# Patient Record
Sex: Female | Born: 1980 | Race: Black or African American | Hispanic: No | Marital: Single | Smoking: Never smoker
Health system: Southern US, Community
[De-identification: ages and names within clinical notes are randomized; demographics above are authoritative.]

## PROBLEM LIST (undated history)

## (undated) DIAGNOSIS — R159 Full incontinence of feces: Secondary | ICD-10-CM

## (undated) DIAGNOSIS — D649 Anemia, unspecified: Secondary | ICD-10-CM

## (undated) DIAGNOSIS — M545 Low back pain, unspecified: Secondary | ICD-10-CM

## (undated) DIAGNOSIS — K219 Gastro-esophageal reflux disease without esophagitis: Secondary | ICD-10-CM

## (undated) DIAGNOSIS — F329 Major depressive disorder, single episode, unspecified: Secondary | ICD-10-CM

## (undated) DIAGNOSIS — F32A Depression, unspecified: Secondary | ICD-10-CM

## (undated) DIAGNOSIS — Z91018 Allergy to other foods: Secondary | ICD-10-CM

## (undated) DIAGNOSIS — K5900 Constipation, unspecified: Secondary | ICD-10-CM

## (undated) DIAGNOSIS — F419 Anxiety disorder, unspecified: Secondary | ICD-10-CM

## (undated) HISTORY — PX: DENTAL SURGERY: SHX609

---

## 2013-05-13 HISTORY — PX: MYOMECTOMY: SHX85

## 2015-03-30 ENCOUNTER — Other Ambulatory Visit: Payer: Self-pay | Admitting: Nurse Practitioner

## 2015-03-30 DIAGNOSIS — R740 Nonspecific elevation of levels of transaminase and lactic acid dehydrogenase [LDH]: Principal | ICD-10-CM

## 2015-03-30 DIAGNOSIS — R1011 Right upper quadrant pain: Secondary | ICD-10-CM

## 2015-03-30 DIAGNOSIS — R7401 Elevation of levels of liver transaminase levels: Secondary | ICD-10-CM

## 2015-04-07 ENCOUNTER — Ambulatory Visit
Admission: RE | Admit: 2015-04-07 | Discharge: 2015-04-07 | Disposition: A | Discharge status: Home / Self Care | Source: Ambulatory Visit | Attending: Nurse Practitioner | Admitting: Nurse Practitioner

## 2015-04-07 DIAGNOSIS — R7401 Elevation of levels of liver transaminase levels: Secondary | ICD-10-CM

## 2015-04-07 DIAGNOSIS — R1011 Right upper quadrant pain: Secondary | ICD-10-CM

## 2015-04-07 DIAGNOSIS — R740 Nonspecific elevation of levels of transaminase and lactic acid dehydrogenase [LDH]: Secondary | ICD-10-CM | POA: Insufficient documentation

## 2015-06-30 NOTE — Discharge Instructions (Signed)

## 2015-07-01 ENCOUNTER — Ambulatory Visit
Admission: RE | Admit: 2015-07-01 | Discharge: 2015-07-01 | Disposition: A | Discharge status: Home / Self Care | Source: Ambulatory Visit | Attending: Gastroenterology | Admitting: Gastroenterology

## 2015-07-01 ENCOUNTER — Encounter
Admission: RE | Disposition: A | Discharge status: Home / Self Care | Payer: Self-pay | Source: Ambulatory Visit | Attending: Gastroenterology

## 2015-07-01 ENCOUNTER — Ambulatory Visit: Admitting: Anesthesiology

## 2015-07-01 DIAGNOSIS — F419 Anxiety disorder, unspecified: Secondary | ICD-10-CM | POA: Insufficient documentation

## 2015-07-01 DIAGNOSIS — K219 Gastro-esophageal reflux disease without esophagitis: Secondary | ICD-10-CM | POA: Insufficient documentation

## 2015-07-01 DIAGNOSIS — Z79899 Other long term (current) drug therapy: Secondary | ICD-10-CM | POA: Insufficient documentation

## 2015-07-01 DIAGNOSIS — K5900 Constipation, unspecified: Secondary | ICD-10-CM | POA: Diagnosis not present

## 2015-07-01 DIAGNOSIS — R159 Full incontinence of feces: Secondary | ICD-10-CM | POA: Diagnosis not present

## 2015-07-01 DIAGNOSIS — Z793 Long term (current) use of hormonal contraceptives: Secondary | ICD-10-CM | POA: Diagnosis not present

## 2015-07-01 HISTORY — DX: Constipation, unspecified: K59.00

## 2015-07-01 HISTORY — DX: Gastro-esophageal reflux disease without esophagitis: K21.9

## 2015-07-01 HISTORY — DX: Full incontinence of feces: R15.9

## 2015-07-01 HISTORY — DX: Anemia, unspecified: D64.9

## 2015-07-01 HISTORY — DX: Allergy to other foods: Z91.018

## 2015-07-01 HISTORY — PX: COLONOSCOPY: SHX5424

## 2015-07-01 HISTORY — DX: Low back pain, unspecified: M54.50

## 2015-07-01 HISTORY — DX: Low back pain: M54.5

## 2015-07-01 SURGERY — COLONOSCOPY
Anesthesia: Monitor Anesthesia Care | Wound class: Contaminated

## 2015-07-01 MED ORDER — STERILE WATER FOR IRRIGATION IR SOLN
Status: DC | PRN
  Administered 2015-07-01: 09:00:00

## 2015-07-01 MED ORDER — OXYCODONE HCL 5 MG PO TABS: 5.0000 mg | ORAL_TABLET | Freq: Once | ORAL | Status: DC | PRN

## 2015-07-01 MED ORDER — OXYCODONE HCL 5 MG/5ML PO SOLN: 5.0000 mg | Freq: Once | ORAL | Status: DC | PRN

## 2015-07-01 MED ORDER — LACTATED RINGERS IV SOLN
INTRAVENOUS | Status: DC
  Administered 2015-07-01: 09:00:00 via INTRAVENOUS

## 2015-07-01 MED ORDER — SODIUM CHLORIDE 0.9 % IV SOLN: INTRAVENOUS | Status: DC

## 2015-07-01 MED ORDER — PROPOFOL 10 MG/ML IV BOLUS
INTRAVENOUS | Status: DC | PRN
  Administered 2015-07-01 (×3): 50 mg via INTRAVENOUS
  Administered 2015-07-01: 100 mg via INTRAVENOUS
  Administered 2015-07-01 (×3): 50 mg via INTRAVENOUS

## 2015-07-01 MED ORDER — LIDOCAINE HCL (CARDIAC) 20 MG/ML IV SOLN
INTRAVENOUS | Status: DC | PRN
  Administered 2015-07-01: 50 mg via INTRAVENOUS

## 2015-07-01 SURGICAL SUPPLY — 30 items
CANISTER SUCT 1200ML W/VALVE (MISCELLANEOUS) ×3 IMPLANT
FCP ESCP3.2XJMB 240X2.8X (MISCELLANEOUS)
FORCEPS BIOP RAD 4 LRG CAP 4 (CUTTING FORCEPS) ×3 IMPLANT
FORCEPS BIOP RJ4 240 W/NDL (MISCELLANEOUS)
FORCEPS ESCP3.2XJMB 240X2.8X (MISCELLANEOUS) IMPLANT
GOWN CVR UNV OPN BCK APRN NK (MISCELLANEOUS) ×1 IMPLANT
GOWN ISOL THUMB LOOP REG UNIV (MISCELLANEOUS) ×2
GOWN STRL REUS W/ TWL LRG LVL3 (GOWN DISPOSABLE) ×1 IMPLANT
GOWN STRL REUS W/TWL LRG LVL3 (GOWN DISPOSABLE) ×2
HEMOCLIP INSTINCT (CLIP) IMPLANT
INJECTOR VARIJECT VIN23 (MISCELLANEOUS) IMPLANT
KIT CO2 TUBING (TUBING) IMPLANT
KIT DEFENDO VALVE AND CONN (KITS) IMPLANT
KIT ENDO PROCEDURE OLY (KITS) ×3 IMPLANT
LIGATOR MULTIBAND 6SHOOTER MBL (MISCELLANEOUS) IMPLANT
MARKER SPOT ENDO TATTOO 5ML (MISCELLANEOUS) IMPLANT
PAD GROUND ADULT SPLIT (MISCELLANEOUS) IMPLANT
SNARE SHORT THROW 13M SML OVAL (MISCELLANEOUS) IMPLANT
SNARE SHORT THROW 30M LRG OVAL (MISCELLANEOUS) IMPLANT
SPOT EX ENDOSCOPIC TATTOO (MISCELLANEOUS)
SUCTION POLY TRAP 4CHAMBER (MISCELLANEOUS) IMPLANT
TRAP SUCTION POLY (MISCELLANEOUS) IMPLANT
TUBING CONN 6MMX3.1M (TUBING)
TUBING SUCTION CONN 0.25 STRL (TUBING) IMPLANT
UNDERPAD 30X60 958B10 (PK) (MISCELLANEOUS) IMPLANT
VALVE BIOPSY ENDO (VALVE) IMPLANT
VARIJECT INJECTOR VIN23 (MISCELLANEOUS)
WATER AUXILLARY (MISCELLANEOUS) IMPLANT
WATER STERILE IRR 250ML POUR (IV SOLUTION) IMPLANT
WATER STERILE IRR 500ML POUR (IV SOLUTION) IMPLANT

## 2015-07-01 NOTE — Op Note (Signed)
[facility] Patient Name: Michaela Moses Procedure Date: 07/01/2015 8:53 AM MRN: [id number] Account #: [id number] Date of Birth: May 26, 1981 Admit Type: Outpatient Age: 34 Room: [facility] OR ROOM 01 Gender: Female Note Status: Finalized Procedure:         Colonoscopy Indications:       Constipation, fecal incontinence Providers:         Ezzard Standing. Bluford Kaufmann, MD Referring MD:      Hassell Halim (Referring MD) Medicines:         Monitored Anesthesia Care Complications:     No immediate complications. Procedure:         Pre-Anesthesia Assessment:                    - Prior to the procedure, a History and Physical was                     performed, and patient medications, allergies and                     sensitivities were reviewed. The patient's tolerance of                     previous anesthesia was reviewed.                    - The risks and benefits of the procedure and the sedation                     options and risks were discussed with the patient. All                     questions were answered and informed consent was obtained.                    - After reviewing the risks and benefits, the patient was                     deemed in satisfactory condition to undergo the procedure.                    After obtaining informed consent, the colonoscope was                     passed under direct vision. Throughout the procedure, the                     patient's blood pressure, pulse, and oxygen saturations                     were monitored continuously. The Olympus CF H180AL                     colonoscope (S#: [id number]) was introduced through the anus                     and advanced to the the terminal ileum, with                     identification of the appendiceal orifice and IC valve.                     The colonoscopy was performed without difficulty. The  patient tolerated the procedure well. The quality of the            bowel preparation was good. Findings:      The colon (entire examined portion) appeared normal. Biopsies for       histology were taken with a cold forceps from the entire colon for       evaluation of microscopic colitis.      The terminal ileum appeared normal. Biopsies were taken with a cold       forceps for histology.      Decreased sphincter tone. Impression:        - The entire examined colon is normal. Biopsied.                    - The examined portion of the ileum was normal. Biopsied. Recommendation:    - Discharge patient to home.                    - Await pathology results.                    - High fiber diet daily.                    - The findings and recommendations were discussed with the                     patient.                    - [facility]. Procedure Code(s): --- Professional ---                    [id number], Colonoscopy, flexible; with biopsy, single or                     multiple Diagnosis Code(s): --- Professional ---                    K59.00, Constipation, unspecified CPT copyright 2014 American Medical Association. All rights reserved. The codes documented in this report are preliminary and upon coder review may  be revised to meet current compliance requirements. Wallace CullensPaul Y Alexzander Dolinger, MD 07/01/2015 9:12:44 AM This report has been signed electronically. Number of Addenda: 0 Note Initiated On: 07/01/2015 8:53 AM Scope Withdrawal Time: 0 hours 4 minutes 2 seconds  Total Procedure Duration: 0 hours 8 minutes 5 seconds       [facility]

## 2015-07-01 NOTE — Transfer of Care (Signed)
Immediate Anesthesia Transfer of Care Note  Patient: Michaela Moses  Procedure(s) Performed: Procedure(s) with comments: COLONOSCOPY (N/A) - U PREG  Patient Location: PACU  Anesthesia Type: MAC  Level of Consciousness: awake, alert  and patient cooperative  Airway and Oxygen Therapy: Patient Spontanous Breathing and Patient connected to supplemental oxygen  Post-op Assessment: Post-op Vital signs reviewed, Patient's Cardiovascular Status Stable, Respiratory Function Stable, Patent Airway and No signs of Nausea or vomiting  Post-op Vital Signs: Reviewed and stable  Complications: No apparent anesthesia complications

## 2015-07-01 NOTE — Anesthesia Preprocedure Evaluation (Signed)
Anesthesia Evaluation  Patient identified by MRN, date of birth, ID band Patient awake    Reviewed: Allergy & Precautions, H&P , NPO status , Patient's Chart, lab work & pertinent test results  Airway Mallampati: I  TM Distance: >3 FB Neck ROM: full    Dental no notable dental hx.    Pulmonary neg pulmonary ROS,    Pulmonary exam normal        Cardiovascular negative cardio ROS Normal cardiovascular exam     Neuro/Psych    GI/Hepatic Neg liver ROS, GERD  Medicated,Some incontinence   Endo/Other  negative endocrine ROS  Renal/GU negative Renal ROS     Musculoskeletal   Abdominal   Peds  Hematology   Anesthesia Other Findings   Reproductive/Obstetrics                             Anesthesia Physical Anesthesia Plan  ASA: I  Anesthesia Plan: MAC   Post-op Pain Management:    Induction:   Airway Management Planned:   Additional Equipment:   Intra-op Plan:   Post-operative Plan:   Informed Consent: I have reviewed the patients History and Physical, chart, labs and discussed the procedure including the risks, benefits and alternatives for the proposed anesthesia with the patient or authorized representative who has indicated his/her understanding and acceptance.     Plan Discussed with: CRNA  Anesthesia Plan Comments:         Anesthesia Quick Evaluation

## 2015-07-01 NOTE — Anesthesia Procedure Notes (Signed)
Procedure Name: MAC Performed by: Baylin Gamblin Pre-anesthesia Checklist: Patient identified, Emergency Drugs available, Suction available, Timeout performed and Patient being monitored Patient Re-evaluated:Patient Re-evaluated prior to inductionOxygen Delivery Method: Nasal cannula Placement Confirmation: positive ETCO2     
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
## 2015-07-01 NOTE — Anesthesia Postprocedure Evaluation (Signed)
  Anesthesia Post-op Note  Patient: Michaela FinnerJennifer Santarelli  Procedure(s) Performed: Procedure(s) with comments: COLONOSCOPY (N/A) - U PREG  Anesthesia type:MAC  Patient location: PACU  Post pain: Pain level controlled  Post assessment: Post-op Vital signs reviewed, Patient's Cardiovascular Status Stable, Respiratory Function Stable, Patent Airway and No signs of Nausea or vomiting  Post vital signs: Reviewed and stable  Last Vitals:  Filed Vitals:   07/01/15 0930  BP: 121/84  Pulse: 78  Temp:   Resp: 17    Level of consciousness: awake, alert  and patient cooperative  Complications: No apparent anesthesia complications

## 2015-07-02 ENCOUNTER — Encounter: Payer: Self-pay | Admitting: Gastroenterology

## 2015-07-06 LAB — SURGICAL PATHOLOGY

## 2015-09-15 ENCOUNTER — Other Ambulatory Visit: Payer: Self-pay | Admitting: Family Medicine

## 2015-09-15 DIAGNOSIS — R1011 Right upper quadrant pain: Secondary | ICD-10-CM

## 2015-09-17 ENCOUNTER — Ambulatory Visit: Admission: RE | Admit: 2015-09-17 | Source: Ambulatory Visit

## 2016-07-26 ENCOUNTER — Emergency Department (INDEPENDENT_AMBULATORY_CARE_PROVIDER_SITE_OTHER)
Admission: EM | Admit: 2016-07-26 | Discharge: 2016-07-26 | Disposition: A | Discharge status: Home / Self Care | Source: Home / Self Care | Attending: Family Medicine | Admitting: Family Medicine

## 2016-07-26 ENCOUNTER — Encounter: Payer: Self-pay | Admitting: *Deleted

## 2016-07-26 DIAGNOSIS — B9789 Other viral agents as the cause of diseases classified elsewhere: Secondary | ICD-10-CM

## 2016-07-26 DIAGNOSIS — J029 Acute pharyngitis, unspecified: Secondary | ICD-10-CM

## 2016-07-26 DIAGNOSIS — J358 Other chronic diseases of tonsils and adenoids: Secondary | ICD-10-CM

## 2016-07-26 DIAGNOSIS — J069 Acute upper respiratory infection, unspecified: Secondary | ICD-10-CM

## 2016-07-26 HISTORY — DX: Anxiety disorder, unspecified: F41.9

## 2016-07-26 HISTORY — DX: Major depressive disorder, single episode, unspecified: F32.9

## 2016-07-26 HISTORY — DX: Depression, unspecified: F32.A

## 2016-07-26 LAB — POCT RAPID STREP A (OFFICE): Rapid Strep A Screen: NEGATIVE

## 2016-07-26 MED ORDER — BENZONATATE 100 MG PO CAPS: 100.0000 mg | ORAL_CAPSULE | Freq: Three times a day (TID) | ORAL | 0 refills | Status: DC

## 2016-07-26 NOTE — ED Triage Notes (Signed)
Pt c/o cough and sore throat x 1 day. Denies fever.
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
## 2016-07-28 ENCOUNTER — Telehealth: Payer: Self-pay | Admitting: *Deleted

## 2016-07-28 MED ORDER — GUAIFENESIN-CODEINE 100-10 MG/5ML PO SOLN: ORAL | 0 refills | Status: DC

## 2016-07-28 NOTE — Telephone Encounter (Signed)
Pt notified of Rx sent to [facility] [place]. Take only at bedtime.

## 2016-07-28 NOTE — Telephone Encounter (Signed)
Patient left message reporting the Kimberlee Nearingessalon is not helping her cough.  She has been unable to sleep due to the persistence of her cough. Please advise.

## 2017-02-03 IMAGING — US US ABDOMEN LIMITED
1 series · 14 of 25 positions shown · non-contrast
Comparison: None.

CLINICAL DATA: Elevated AST, right upper quadrant pain

EXAM:
US ABDOMEN LIMITED - RIGHT UPPER QUADRANT

[Series 1: us abdomen limited · 0.25mm/px · 14 of 45 slices shown]
[im 1/45]
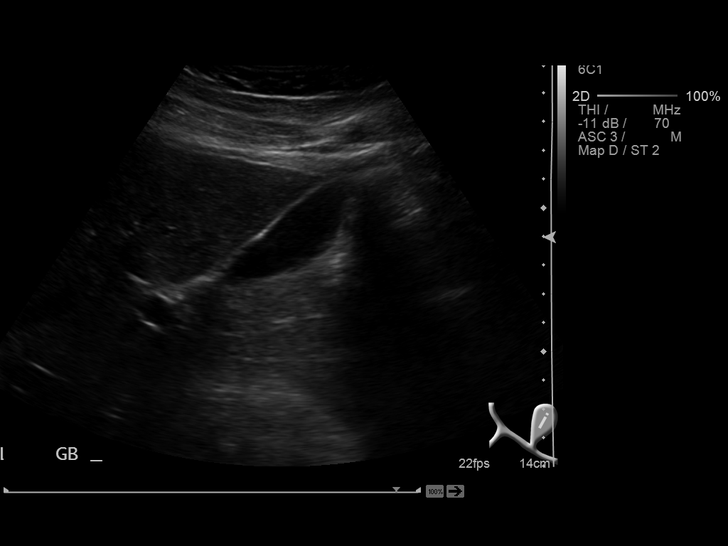
[im 4/45]
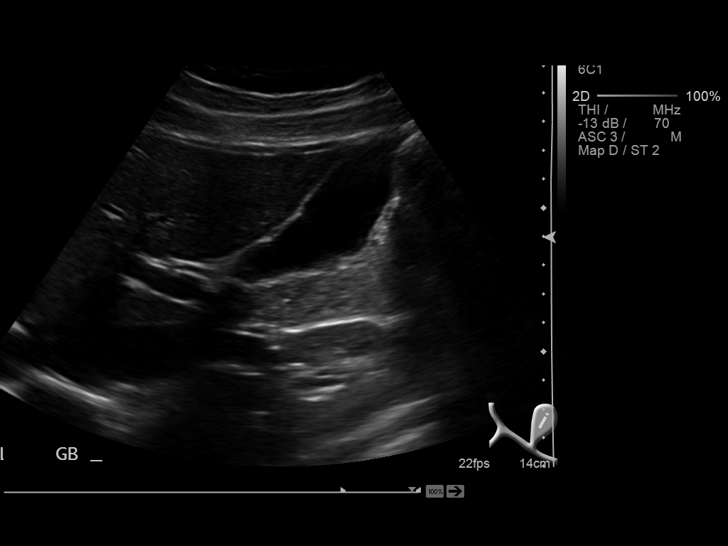
[im 8/45]
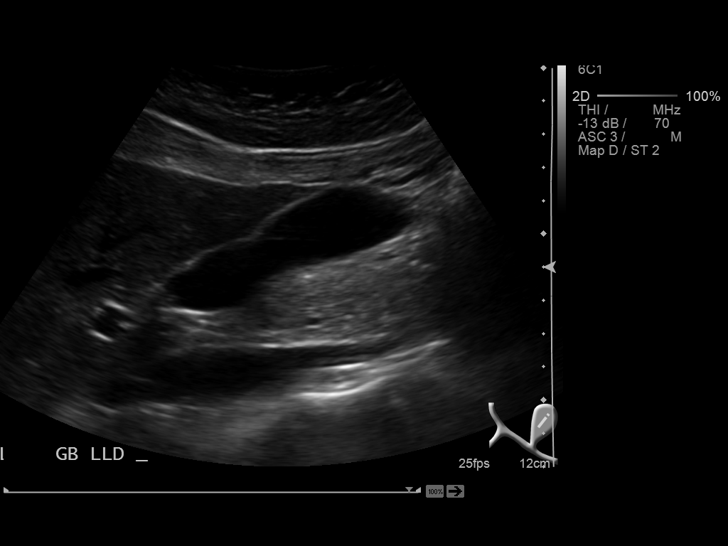
[im 12/45]
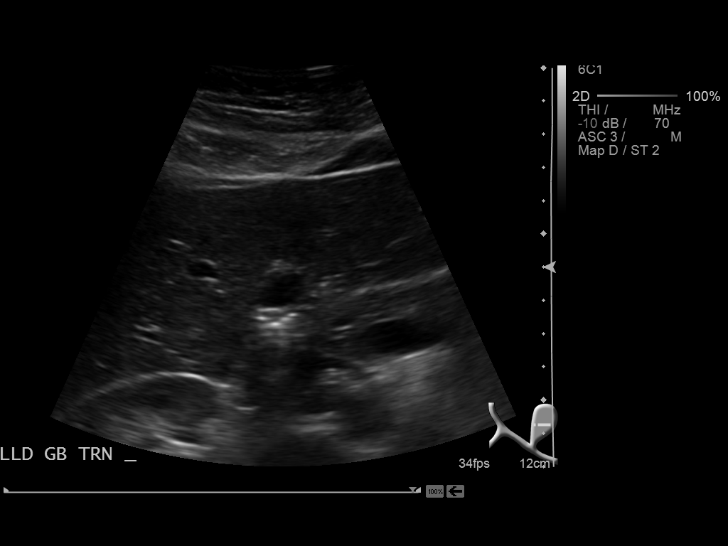
[im 15/45]
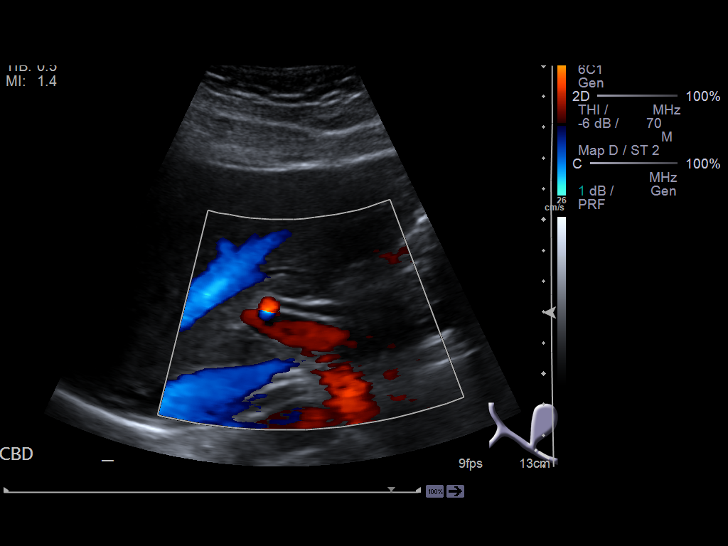
[im 17/45]
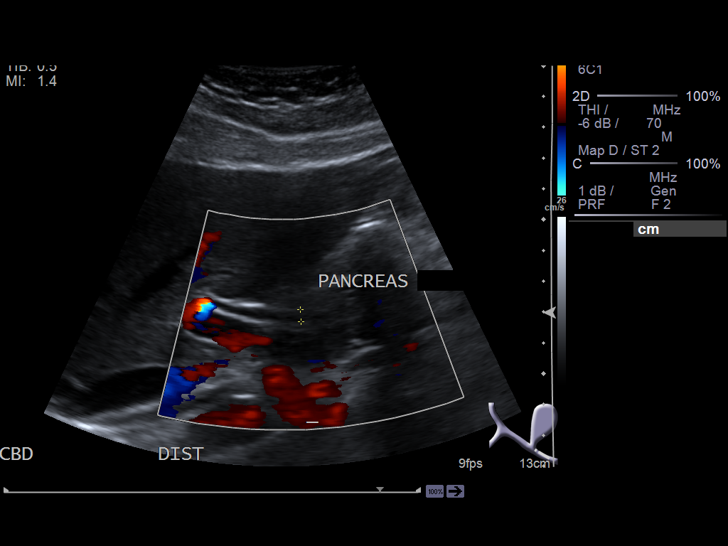
[im 21/45]
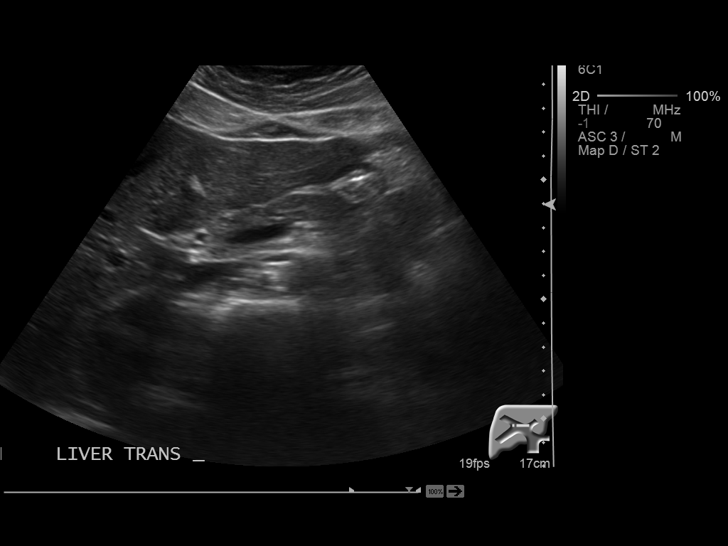
[im 24/45]
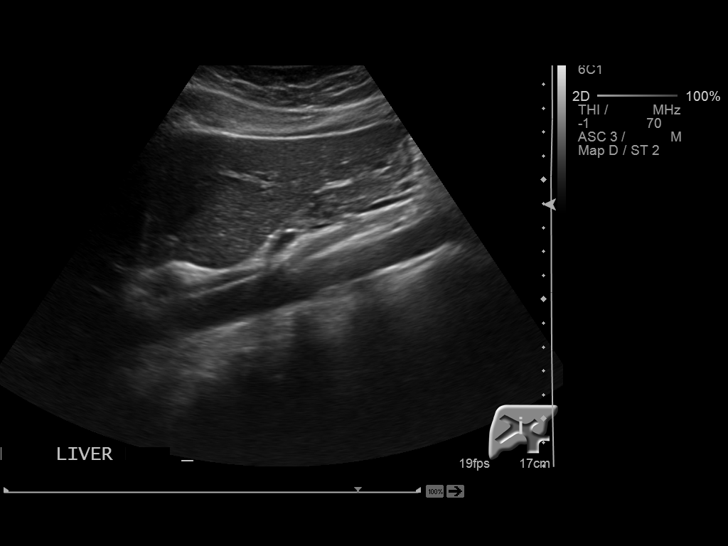
[im 28/45]
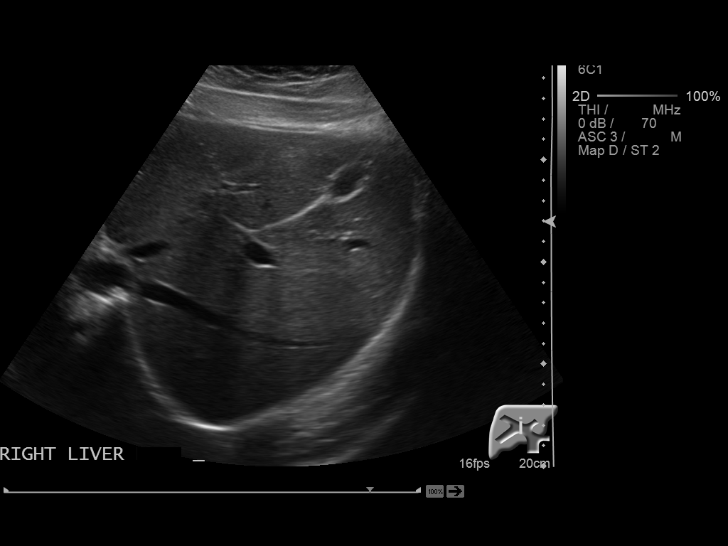
[im 30/45]
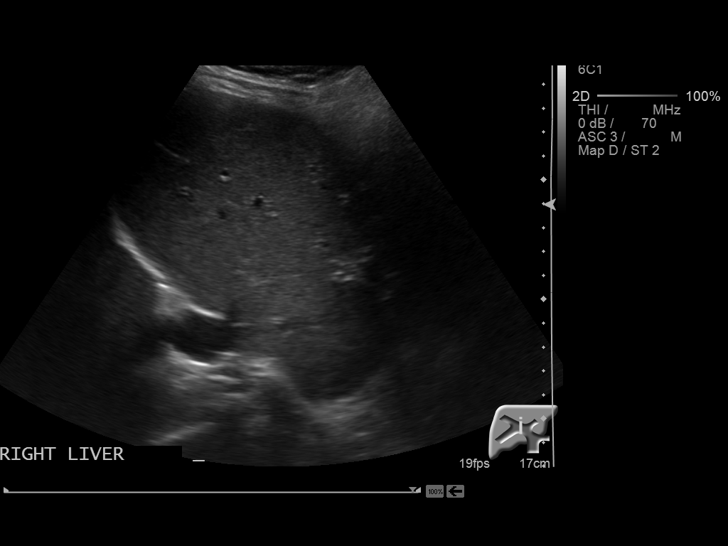
[im 34/45]
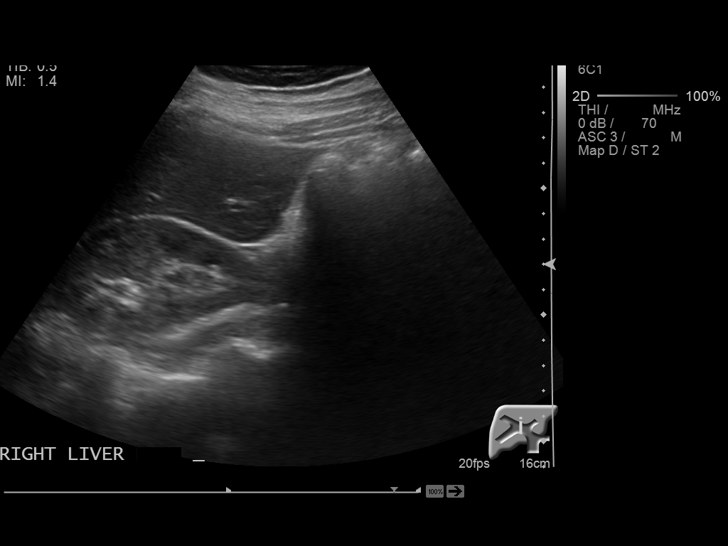
[im 37/45]
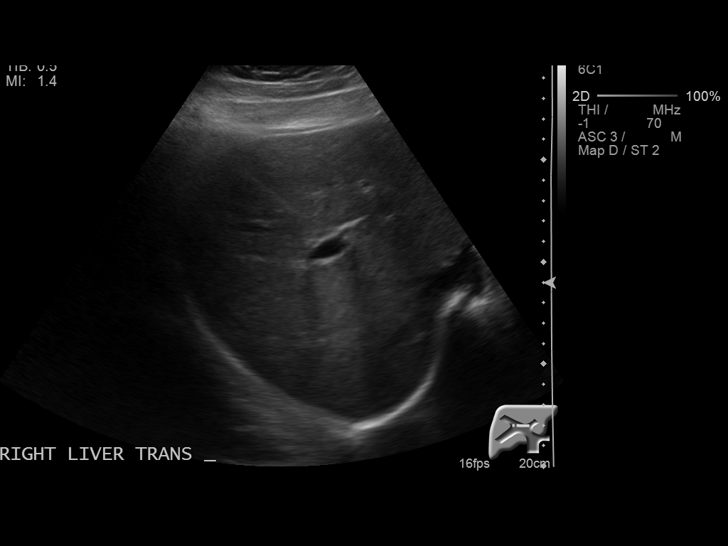
[im 41/45]
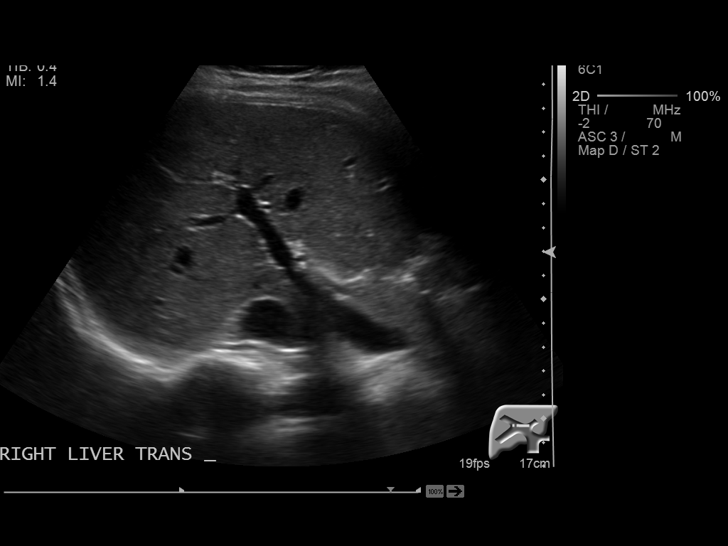
[im 45/45]
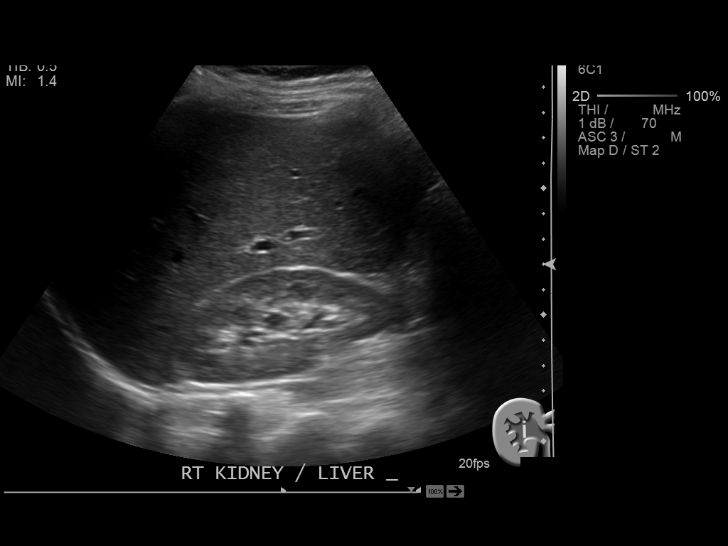

[14 of 25 positions shown; findings below may reference images not displayed]

FINDINGS: Gallbladder:

The gallbladder is visualized and no gallstones are noted. There is
no pain over the gallbladder with compression.

Common bile duct:

Diameter: The common bile duct is normal measuring 2.8 mm. The
common bile duct measures 3.9 mm distally which is within normal
limits.

Liver:

The liver has a normal echogenic pattern. No focal abnormality is
seen.
IMPRESSION: Negative limited ultrasound of the right upper quadrant.

## 2024-04-12 ENCOUNTER — Ambulatory Visit (INDEPENDENT_AMBULATORY_CARE_PROVIDER_SITE_OTHER)

## 2024-04-12 ENCOUNTER — Ambulatory Visit
Admission: RE | Admit: 2024-04-12 | Discharge: 2024-04-12 | Disposition: A | Discharge status: Home / Self Care | Source: Ambulatory Visit

## 2024-04-12 VITALS — BP 130/87 | HR 76 | Temp 97.9°F | Resp 18

## 2024-04-12 DIAGNOSIS — M79632 Pain in left forearm: Secondary | ICD-10-CM

## 2024-04-12 DIAGNOSIS — S5012XA Contusion of left forearm, initial encounter: Secondary | ICD-10-CM | POA: Diagnosis not present
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
## 2024-04-12 NOTE — Discharge Instructions (Signed)
 The x-ray of your left forearm did not reveal any acute bony injury.  The pain in your forearm is most likely due to bruising, also noticed a contusion  I have enclosed some information about contusions that I hope you find helpful.    You are welcome to continue applying ice to the affected area as you like.  You are also welcome to take ibuprofen and/or Tylenol as needed.  Full range of motion without pain should return within the next 4 to 5 days.    Thank you for visiting [facility] today.  We appreciate opportunity to participate in your care.

## 2024-04-12 NOTE — ED Triage Notes (Signed)
 Pt states she fell down a flight of stairs this morning at her apartment. Pt states she slid and she denies hitting her head or losing consciousness. She landed on her left arm and since then her left forearm has been hurting.   Home Interventions: Ice
